# Patient Record
Sex: Male | Born: 1985 | Race: White | Hispanic: No | Marital: Single | Smoking: Former smoker
Health system: Southern US, Community
[De-identification: ages and names within clinical notes are randomized; demographics above are authoritative.]

## PROBLEM LIST (undated history)

## (undated) DIAGNOSIS — I10 Essential (primary) hypertension: Secondary | ICD-10-CM

## (undated) DIAGNOSIS — E7800 Pure hypercholesterolemia, unspecified: Secondary | ICD-10-CM

## (undated) HISTORY — DX: Pure hypercholesterolemia, unspecified: E78.00

## (undated) HISTORY — PX: APPENDECTOMY: SHX54

## (undated) HISTORY — PX: TONSILLECTOMY: SUR1361

## (undated) HISTORY — PX: MOUTH SURGERY: SHX715

## (undated) HISTORY — DX: Essential (primary) hypertension: I10

---

## 2017-03-23 ENCOUNTER — Ambulatory Visit (INDEPENDENT_AMBULATORY_CARE_PROVIDER_SITE_OTHER): Payer: 59 | Admitting: Neurology

## 2017-03-23 ENCOUNTER — Encounter (INDEPENDENT_AMBULATORY_CARE_PROVIDER_SITE_OTHER): Payer: Self-pay

## 2017-03-23 ENCOUNTER — Encounter: Payer: Self-pay | Admitting: Neurology

## 2017-03-23 DIAGNOSIS — R42 Dizziness and giddiness: Secondary | ICD-10-CM | POA: Diagnosis not present

## 2017-03-23 DIAGNOSIS — G44209 Tension-type headache, unspecified, not intractable: Secondary | ICD-10-CM | POA: Diagnosis not present

## 2017-03-23 NOTE — Progress Notes (Signed)
PATIENT: Garrett Harris DOB: Nov 12, 1985  Chief Complaint  Patient presents with  . NP PRADHAN  . light headedness/ tingling in weakness in arms and legs.    being worked up by cardiology.      HISTORICAL  Garrett Harris a 31 years old right-handed male, seen in refer by his primary care physician Dr.  Vernell Leep, Trudie Reed K, for evaluation of lightheadedness and  numbness tingling in arms and legs,initial evaluation was on August sixth 2018.  I reviewed and summarized the referring notes, he has past medical history of hyperlipidemia,  He was taken to the [facility] emergency room on March 09 2017,one hour after lunch, he was sitting at his desk, he  felt heart palpitation, lightheadedness, when he get up walking, he felt weakness, arm tingling sensation, as if he is gong to passout, he was taken by his coworker to the emergency room, noted mild elevated blood pressure165 over 95, EKG was normal, he has mild headache afterwards.  He later had cardiac evaluations, Stress test is pending,48 Holter monitoring was completed, no significant abnormalities found.  Laboratory evaluations Vitamin B12 370, normal CBC hemoglobin of 15.5, LDL was mildly elevated 139, total cholesterol was 155, triglyceride was 261, normal CMP, creatinine was 1.0  REVIEW OF SYSTEMS: Full 14 system review of systems performed and notable only for headache, numbness, weakness, dizziness, insomnia, blurred vision, shortness of breath, chest pain, spinning sensation  ALLERGIES: No Known Allergies  HOME MEDICATIONS: Current Outpatient Prescriptions  Medication Sig Dispense Refill  . atorvastatin (LIPITOR) 20 MG tablet Take 20 mg by mouth daily at 6 PM.    . cholecalciferol (VITAMIN D) 1000 units tablet Take 1,000 Units by mouth daily.    . Melatonin 3 MG TABS Take by mouth. Takes one nightly    . Omega-3 Fatty Acids (FISH OIL) 1000 MG CAPS Take by mouth. One twice daily     No current facility-administered medications for  this visit.     PAST MEDICAL HISTORY: Past Medical History:  Diagnosis Date  . High cholesterol   . Hypertension     PAST SURGICAL HISTORY: Past Surgical History:  Procedure Laterality Date  . APPENDECTOMY    . MOUTH SURGERY     removed uvula  . TONSILLECTOMY      FAMILY HISTORY: Family History  Problem Relation Age of Onset  . Liver cancer Father     SOCIAL HISTORY:  Social History   Social History  . Marital status: Single    Spouse name: N/A  . Number of children: N/A  . Years of education: N/A   Occupational History  . Not on file.   Social History Main Topics  . Smoking status: Former Smoker    Types: E-cigarettes    Quit date: 08/19/2015  . Smokeless tobacco: Never Used  . Alcohol use Yes     Comment: 2-3 x yearly  . Drug use: No  . Sexual activity: Not on file   Other Topics Concern  . Not on file   Social History Narrative   Lives home alone.  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  No children.        PHYSICAL EXAM   Vitals:   03/23/17 1252  BP: (!) 153/95  Pulse: 94  Weight: (!) 338 lb (153.3 kg)  Height: 6' 1.5" (1.867 m)    Not recorded      Body mass index is 43.99 kg/m.  PHYSICAL EXAMNIATION:  Gen: NAD, conversant, well nourised, obese,  well groomed                     Cardiovascular: Regular rate rhythm, no peripheral edema, warm, nontender. Eyes: Conjunctivae clear without exudates or hemorrhage Neck: Supple, no carotid bruits. Pulmonary: Clear to auscultation bilaterally   NEUROLOGICAL EXAM:  MENTAL STATUS: Speech:    Speech is normal; fluent and spontaneous with normal comprehension.  Cognition:     Orientation to time, place and person     Normal recent and remote memory     Normal Attention span and concentration     Normal Language, naming, repeating,spontaneous speech     Fund of knowledge   CRANIAL NERVES: CN II: Visual fields are full to confrontation. Fundoscopic exam is normal with sharp discs and  no vascular changes. Pupils are round equal and briskly reactive to light. CN III, IV, VI: extraocular movement are normal. No ptosis. CN V: Facial sensation is intact to pinprick in all 3 divisions bilaterally. Corneal responses are intact.  CN VII: Face is symmetric with normal eye closure and smile. CN VIII: Hearing is normal to rubbing fingers CN IX, X: Palate elevates symmetrically. Phonation is normal. CN XI: Head turning and shoulder shrug are intact CN XII: Tongue is midline with normal movements and no atrophy.  MOTOR: There is no pronator drift of out-stretched arms. Muscle bulk and tone are normal. Muscle strength is normal.  REFLEXES: Reflexes are 2+ and symmetric at the biceps, triceps, knees, and ankles. Plantar responses are flexor.  SENSORY: Intact to light touch, pinprick, positional sensation and vibratory sensation are intact in fingers and toes.  COORDINATION: Rapid alternating movements and fine finger movements are intact. There is no dysmetria on finger-to-nose and heel-knee-shin.    GAIT/STANCE: Posture is normal. Gait is steady with normal steps, base, arm swing, and turning. Heel and toe walking are normal. Tandem gait is normal.  Romberg is absent.   DIAGNOSTIC DATA (LABS, IMAGING, TESTING) - I reviewed patient records, labs, notes, testing and imaging myself where available.   ASSESSMENT AND PLAN  Garrett Harris is a 31 y.o. male   Acute onset dizziness, paresthesia,  Most consistent with presyncope,   After discuss with patient, will proceed with MRI brain to rule out CNS structure lesion   Garrett Harris, M.D. Ph.D.  [facility] [address], [address] [place], [place] [address] Ph: [phone] Fax: [phone]  CC: Elise BennePradhan, Pradeep K, MD

## 2017-03-30 ENCOUNTER — Other Ambulatory Visit: Payer: Self-pay | Admitting: Neurology

## 2017-03-30 DIAGNOSIS — T1590XA Foreign body on external eye, part unspecified, unspecified eye, initial encounter: Secondary | ICD-10-CM

## 2017-04-10 ENCOUNTER — Inpatient Hospital Stay: Admission: RE | Admit: 2017-04-10 | Payer: Self-pay | Source: Ambulatory Visit

## 2017-04-10 ENCOUNTER — Other Ambulatory Visit: Payer: 59

## 2017-04-10 ENCOUNTER — Other Ambulatory Visit: Payer: Self-pay

## 2017-04-21 ENCOUNTER — Ambulatory Visit
Admission: RE | Admit: 2017-04-21 | Discharge: 2017-04-21 | Disposition: A | Discharge status: Home / Self Care | Payer: 59 | Source: Ambulatory Visit | Attending: Neurology | Admitting: Neurology

## 2017-04-21 DIAGNOSIS — G44209 Tension-type headache, unspecified, not intractable: Secondary | ICD-10-CM | POA: Diagnosis not present

## 2017-04-21 DIAGNOSIS — T1590XA Foreign body on external eye, part unspecified, unspecified eye, initial encounter: Secondary | ICD-10-CM

## 2017-08-27 ENCOUNTER — Encounter: Payer: Self-pay | Admitting: Neurology

## 2017-08-27 ENCOUNTER — Ambulatory Visit (INDEPENDENT_AMBULATORY_CARE_PROVIDER_SITE_OTHER): Payer: BLUE CROSS/BLUE SHIELD | Admitting: Neurology

## 2017-08-27 VITALS — BP 156/93 | HR 89 | Ht 73.5 in | Wt 346.5 lb

## 2017-08-27 DIAGNOSIS — R42 Dizziness and giddiness: Secondary | ICD-10-CM | POA: Diagnosis not present

## 2017-08-27 DIAGNOSIS — R410 Disorientation, unspecified: Secondary | ICD-10-CM | POA: Diagnosis not present

## 2017-08-27 MED ORDER — DIVALPROEX SODIUM ER 500 MG PO TB24: 500.0000 mg | ORAL_TABLET | Freq: Every day | ORAL | 11 refills | Status: DC

## 2017-08-27 NOTE — Progress Notes (Signed)
PATIENT: Garrett Harris DOB: 1986-02-06  Chief Complaint  Patient presents with  . Seizures    Patient referred for possible seizures and/or evaluate for MS  . PCP    Dr. Corrie Mckusick  . Referred by    ENT - Merri Brunette, PA     HISTORICAL  Garrett Harris a 32 years old right-handed male, seen in refer by his primary care physician Dr.  Vernell Leep, Trudie Reed K, for evaluation of lightheadedness and  numbness tingling in arms and legs,initial evaluation was on August sixth 2018.  I reviewed and summarized the referring notes, he has past medical history of hyperlipidemia,  He was taken to the [facility] emergency room on March 09 2017,one hour after lunch, he was sitting at his desk, he  felt heart palpitation, lightheadedness, when he get up walking, he felt weakness, arm tingling sensation, as if he is gong to passout, he was taken by his coworker to the emergency room, noted mild elevated blood pressure165 over 95, EKG was normal, he has mild headache afterwards.  He later had cardiac evaluations, Stress test is pending,48 Holter monitoring was completed, no significant abnormalities found.  Laboratory evaluations Vitamin B12 370, normal CBC hemoglobin of 15.5, LDL was mildly elevated 139, total cholesterol was 155, triglyceride was 261, normal CMP, creatinine was 1.0  UPDATE Aug 27 2017: He was seen by Dr. Earna Coder, Cardiologist, stress test, ECHO, Holter monitor 48 hours and 30 days were normal other than showed 3 second asymptomatic pause  He has few push button events, but no abnormal EKG findings.   He still complains of frequent light headed sensation, almost daily, it usually happened after lunch, get up walking, going to pass out, knee were heavy, symptoms improve over few minutes.  He was diagnosed with obstructive sleep apnea at age 44, had uvuloplasty surgery, which has helped his snoring, but he continues to have daytime sleepiness, difficulty falling asleep at nighttime,  take melatonin, but was previously diagnosed with circadian rhythm problem  REVIEW OF SYSTEMS: Full 14 system review of systems performed and notable only for headache, numbness, weakness, dizziness, insomnia, blurred vision, shortness of breath, chest pain, spinning sensation  ALLERGIES: No Known Allergies  HOME MEDICATIONS: Current Outpatient Medications  Medication Sig Dispense Refill  . atorvastatin (LIPITOR) 40 MG tablet Take 40 mg by mouth daily.    . cholecalciferol (VITAMIN D) 1000 units tablet Take 1,000 Units by mouth daily.    . Melatonin 3 MG TABS Take by mouth. Takes one nightly    . Omega-3 Fatty Acids (FISH OIL) 1000 MG CAPS Take by mouth. One twice daily     No current facility-administered medications for this visit.     PAST MEDICAL HISTORY: Past Medical History:  Diagnosis Date  . High cholesterol   . Hypertension     PAST SURGICAL HISTORY: Past Surgical History:  Procedure Laterality Date  . APPENDECTOMY    . MOUTH SURGERY     removed uvula  . TONSILLECTOMY      FAMILY HISTORY: Family History  Problem Relation Age of Onset  . Liver cancer Father     SOCIAL HISTORY:  Social History   Social History  . Marital status: Single    Spouse name: N/A  . Number of children: N/A  . Years of education: N/A   Occupational History  . Not on file.   Social History Main Topics  . Smoking status: Former Smoker    Types: E-cigarettes    Quit  date: 08/19/2015  . Smokeless tobacco: Never Used  . Alcohol use Yes     Comment: 2-3 x yearly  . Drug use: No  . Sexual activity: Not on file   Other Topics Concern  . Not on file   Social History Narrative   Lives home alone.  Education AD.  [employer].  No children.        PHYSICAL EXAM   Vitals:   08/27/17 1350  BP: (!) 156/93  Pulse: 89  Weight: (!) 346 lb 8 oz (157.2 kg)  Height: 6' 1.5" (1.867 m)    Not recorded      Body mass index is 45.1 kg/m.  PHYSICAL  EXAMNIATION:  Gen: NAD, conversant, well nourised, obese, well groomed                     Cardiovascular: Regular rate rhythm, no peripheral edema, warm, nontender. Eyes: Conjunctivae clear without exudates or hemorrhage Neck: Supple, no carotid bruits. Pulmonary: Clear to auscultation bilaterally   NEUROLOGICAL EXAM:  MENTAL STATUS: Speech:    Speech is normal; fluent and spontaneous with normal comprehension.  Cognition:     Orientation to time, place and person     Normal recent and remote memory     Normal Attention span and concentration     Normal Language, naming, repeating,spontaneous speech     Fund of knowledge   CRANIAL NERVES: CN II: Visual fields are full to confrontation. Fundoscopic exam is normal with sharp discs and no vascular changes. Pupils are round equal and briskly reactive to light. CN III, IV, VI: extraocular movement are normal. No ptosis. CN V: Facial sensation is intact to pinprick in all 3 divisions bilaterally. Corneal responses are intact.  CN VII: Face is symmetric with normal eye closure and smile. CN VIII: Hearing is normal to rubbing fingers CN IX, X: Palate elevates symmetrically. Phonation is normal. CN XI: Head turning and shoulder shrug are intact CN XII: Tongue is midline with normal movements and no atrophy.  MOTOR: There is no pronator drift of out-stretched arms. Muscle bulk and tone are normal. Muscle strength is normal.  REFLEXES: Reflexes are 2+ and symmetric at the biceps, triceps, knees, and ankles. Plantar responses are flexor.  SENSORY: Intact to light touch, pinprick, positional sensation and vibratory sensation are intact in fingers and toes.  COORDINATION: Rapid alternating movements and fine finger movements are intact. There is no dysmetria on finger-to-nose and heel-knee-shin.    GAIT/STANCE: Posture is normal. Gait is steady with normal steps, base, arm swing, and turning. Heel and toe walking are normal. Tandem gait  is normal.  Romberg is absent.   DIAGNOSTIC DATA (LABS, IMAGING, TESTING) - I reviewed patient records, labs, notes, testing and imaging myself where available.   ASSESSMENT AND PLAN  Garrett Harris is a 32 y.o. male   Recurrent episode of transient lightheadedness  Differentiation diagnosis includes metabolic etiology, hypoglycemia, excessive sleepiness, remote possibility of seizure  Laboratory evaluations  EEG  Empirically treat with Depakote ER 500 mg daily  Refer him to sleep study  Levert FeinsteinYijun Yanna Leaks, M.D. Ph.D.  [facility] [address] [place], [place] [address] Ph: [phone] Fax: [phone]  CC: Elise BennePradhan, Pradeep K, MD

## 2017-08-28 LAB — C-REACTIVE PROTEIN: CRP: 2.2 mg/L (ref 0.0–4.9)

## 2017-08-28 LAB — CBC WITH DIFFERENTIAL/PLATELET
BASOS: 1 %
Basophils Absolute: 0.1 10*3/uL (ref 0.0–0.2)
EOS (ABSOLUTE): 0.3 10*3/uL (ref 0.0–0.4)
EOS: 3 %
HEMOGLOBIN: 16.1 g/dL (ref 13.0–17.7)
Hematocrit: 47.2 % (ref 37.5–51.0)
IMMATURE GRANS (ABS): 0 10*3/uL (ref 0.0–0.1)
Immature Granulocytes: 0 %
Lymphocytes Absolute: 3 10*3/uL (ref 0.7–3.1)
Lymphs: 29 %
MCH: 29.8 pg (ref 26.6–33.0)
MCHC: 34.1 g/dL (ref 31.5–35.7)
MCV: 87 fL (ref 79–97)
MONOCYTES: 8 %
Monocytes Absolute: 0.8 10*3/uL (ref 0.1–0.9)
NEUTROS ABS: 6.2 10*3/uL (ref 1.4–7.0)
Neutrophils: 59 %
Platelets: 253 10*3/uL (ref 150–379)
RBC: 5.41 x10E6/uL (ref 4.14–5.80)
RDW: 13.6 % (ref 12.3–15.4)
WBC: 10.4 10*3/uL (ref 3.4–10.8)

## 2017-08-28 LAB — COMPREHENSIVE METABOLIC PANEL
ALBUMIN: 4.6 g/dL (ref 3.5–5.5)
ALT: 26 IU/L (ref 0–44)
AST: 23 IU/L (ref 0–40)
Albumin/Globulin Ratio: 1.6 (ref 1.2–2.2)
Alkaline Phosphatase: 123 IU/L — ABNORMAL HIGH (ref 39–117)
BILIRUBIN TOTAL: 0.4 mg/dL (ref 0.0–1.2)
BUN / CREAT RATIO: 11 (ref 9–20)
BUN: 11 mg/dL (ref 6–20)
CHLORIDE: 105 mmol/L (ref 96–106)
CO2: 22 mmol/L (ref 20–29)
CREATININE: 0.96 mg/dL (ref 0.76–1.27)
Calcium: 9.4 mg/dL (ref 8.7–10.2)
GFR calc non Af Amer: 105 mL/min/{1.73_m2} (ref >59)
GFR, EST AFRICAN AMERICAN: 121 mL/min/{1.73_m2} (ref >59)
GLUCOSE: 88 mg/dL (ref 65–99)
Globulin, Total: 2.8 g/dL (ref 1.5–4.5)
Potassium: 4.4 mmol/L (ref 3.5–5.2)
Sodium: 146 mmol/L — ABNORMAL HIGH (ref 134–144)
TOTAL PROTEIN: 7.4 g/dL (ref 6.0–8.5)

## 2017-08-28 LAB — SEDIMENTATION RATE: Sed Rate: 15 mm/hr (ref 0–15)

## 2017-08-28 LAB — HGB A1C W/O EAG: Hgb A1c MFr Bld: 5.2 % (ref 4.8–5.6)

## 2017-08-28 LAB — VITAMIN B12: Vitamin B-12: 466 pg/mL (ref 232–1245)

## 2017-08-30 ENCOUNTER — Telehealth: Payer: Self-pay | Admitting: *Deleted

## 2017-08-30 NOTE — Telephone Encounter (Signed)
-----   Message from Levert FeinsteinYijun [name], MD sent at 08/28/2017 12:08 PM EST ----- Please call patient no significant abnormality on laboratory evaluations,

## 2017-08-31 NOTE — Telephone Encounter (Signed)
Spoke to patient he is aware of results.

## 2017-09-03 ENCOUNTER — Ambulatory Visit (INDEPENDENT_AMBULATORY_CARE_PROVIDER_SITE_OTHER): Payer: BLUE CROSS/BLUE SHIELD | Admitting: Neurology

## 2017-09-03 DIAGNOSIS — R42 Dizziness and giddiness: Secondary | ICD-10-CM

## 2017-09-03 DIAGNOSIS — R410 Disorientation, unspecified: Secondary | ICD-10-CM | POA: Diagnosis not present

## 2017-09-04 NOTE — Procedures (Signed)
   HISTORY: 32 years old male, complains of intermittent episode of lightheadedness,  TECHNIQUE:  16 channel EEG was performed based on standard 10-16 international system. One channel was dedicated to EKG, which has demonstrates normal sinus rhythm of 78 beats per minutes.  Upon awakening, the posterior background activity was well-developed, in alpha range, 11 Hz, reactive to eye opening and closure.  There was no evidence of epileptiform discharge.  Photic stimulation was performed, which induced a symmetric photic driving.  Hyperventilation was performed, there was no abnormality elicit.  No sleep was achieved.  CONCLUSION: This is a  normal awake EEG.  There is no electrodiagnostic evidence of epileptiform discharge.  Levert FeinsteinYijun Dajohn Ellender, M.D. Ph.D.  [facility] [address] [place], [place] [address] Phone: [phone] Fax:      [phone]

## 2017-09-21 ENCOUNTER — Encounter: Payer: Self-pay | Admitting: Neurology

## 2017-09-21 ENCOUNTER — Ambulatory Visit (INDEPENDENT_AMBULATORY_CARE_PROVIDER_SITE_OTHER): Payer: BLUE CROSS/BLUE SHIELD | Admitting: Neurology

## 2017-09-21 VITALS — BP 138/89 | HR 91 | Ht 73.5 in | Wt 343.0 lb

## 2017-09-21 DIAGNOSIS — G479 Sleep disorder, unspecified: Secondary | ICD-10-CM

## 2017-09-21 DIAGNOSIS — Z8669 Personal history of other diseases of the nervous system and sense organs: Secondary | ICD-10-CM

## 2017-09-21 DIAGNOSIS — Z6841 Body Mass Index (BMI) 40.0 and over, adult: Secondary | ICD-10-CM | POA: Diagnosis not present

## 2017-09-21 DIAGNOSIS — Z820 Family history of epilepsy and other diseases of the nervous system: Secondary | ICD-10-CM

## 2017-09-21 NOTE — Patient Instructions (Addendum)

## 2017-09-21 NOTE — Progress Notes (Signed)
Subjective:    Patient ID: Garrett Harris is a 32 y.o. male.  HPI     Huston FoleySaima Venissa Nappi, MD, PhD [facility] [address], [address] [address] [place], [place] [address]  Dear Garrett EwingYijun,   I saw your patient, Garrett Harris Mcgillicuddy, upon your kind request, in my clinic today for initial consultation of his sleep disorder, concern for underlying obstructive sleep apnea. The patient is unaccompanied today. As you know, Mr. Garrett Harris is a 32 year old right-handed gentleman with an underlying medical history of palpitations, lightheadedness, recent dizzy spell, hypertension, hyperlipidemia, snoring, and or obstructive sleep apnea as a teenager with status post uvulectomy and TE/AE at age 32 or [age], and history of morbid obesity with a BMI of over 40, who reports a prior diagnosis of obstructive sleep apnea, he did not tolerate CPAP as a teenager and ended up having surgery in 2005. I reviewed your office note from 08/27/2017. He has gained weight in the past, then tried to lose weight, now plateaued.  His Epworth sleepiness score is 5 out of 24 today, fatigue score is 22 out of 63. He is single and lives alone. He has a long-standing history of difficulty falling asleep. This started when he was a child. He remembers having difficulty going to sleep and a tendency to sleep longer into the morning, missing classes when he was in high school. Bedtime currently is around 9 or 10, he may not be asleep until midnight typically. He tries to take melatonin around 7. His mother has obstructive sleep apnea and uses a CPAP machine. He denies nocturia. Of note, he has not yet started Depakote that was prescribed to him by you, for fear of side effects. He is going to see a GI doctor soon and will most likely start the medication after his GI appointment.  His Past Medical History Is Significant For: Past Medical History:  Diagnosis Date  . High cholesterol   . Hypertension     His Past Surgical History Is  Significant For: Past Surgical History:  Procedure Laterality Date  . APPENDECTOMY    . MOUTH SURGERY     removed uvula  . TONSILLECTOMY      His Family History Is Significant For: Family History  Problem Relation Age of Onset  . Liver cancer Father     His Social History Is Significant For: Social History   Socioeconomic History  . Marital status: Single    Spouse name: None  . Number of children: None  . Years of education: None  . Highest education level: None  Social Needs  . Financial resource strain: None  . Food insecurity - worry: None  . Food insecurity - inability: None  . Transportation needs - medical: None  . Transportation needs - non-medical: None  Occupational History  . None  Tobacco Use  . Smoking status: Former Smoker    Types: E-cigarettes    Last attempt to quit: 08/19/2015    Years since quitting: 2.0  . Smokeless tobacco: Never Used  Substance and Sexual Activity  . Alcohol use: Yes    Comment: 2-3 x yearly  . Drug use: No  . Sexual activity: None  Other Topics Concern  . None  Social History Narrative   Lives home alone.  Education AD.  [employer].  No children.    His Allergies Are:  No Known Allergies:   His Current Medications Are:  Outpatient Encounter Medications as of 09/21/2017  Medication Sig  .  atorvastatin (LIPITOR) 40 MG tablet Take 40 mg by mouth daily.  . cholecalciferol (VITAMIN D) 1000 units tablet Take 1,000 Units by mouth daily.  . divalproex (DEPAKOTE ER) 500 MG 24 hr tablet Take 1 tablet (500 mg total) by mouth at bedtime.  . Melatonin 3 MG TABS Take by mouth. Takes one nightly  . Omega-3 Fatty Acids (FISH OIL) 1000 MG CAPS Take by mouth. One twice daily   No facility-administered encounter medications on file as of 09/21/2017.   :  Review of Systems:  Out of a complete 14 point review of systems, all are reviewed and negative with the exception of these symptoms as listed below: Review of Systems   Neurological:       Patient has seen multiple specialists for his symptoms but no one can seem to pinpoint a reason or source. Patient saw Dr. Terrace Arabia a few weeks ago and she is recommending a sleep study.   Epworth Sleepiness Scale 0= would never doze 1= slight chance of dozing 2= moderate chance of dozing 3= high chance of dozing  Sitting and reading: 0 Watching TV: 1 Sitting inactive in a public place (ex. Theater or meeting): 1 As a passenger in a car for an hour without a break: 0 Lying down to rest in the afternoon: 2 Sitting and talking to someone: 0 Sitting quietly after lunch (no alcohol): 1 In a car, while stopped in traffic: 0 Total: 5     Objective:  Neurological Exam  Physical Exam Physical Examination:   Vitals:   09/21/17 1509  BP: 138/89  Pulse: 91    General Examination: The patient is a very pleasant 32 y.o. male in no acute distress. He appears well-developed and well-nourished and well groomed.   HEENT: Normocephalic, atraumatic, pupils are equal, round and reactive to light and accommodation. Funduscopic exam is normal with sharp disc margins noted. Extraocular tracking is good without limitation to gaze excursion or nystagmus noted. Normal smooth pursuit is noted. Hearing is grossly intact. Tympanic membranes are clear bilaterally. Face is symmetric with normal facial animation and normal facial sensation. Speech is clear with no dysarthria noted. There is no hypophonia. There is no lip, neck/head, jaw or voice tremor. Neck is supple with full range of passive and active motion. There are no carotid bruits on auscultation. Oropharynx exam reveals: mild mouth dryness, adequate dental hygiene and moderate airway crowding, due to smaller airway entry, thicker soft palate. Tonsils and uvula are absent. Mallampati is class I. Neck circumference is 18-1/8 inches.  Chest: Clear to auscultation without wheezing, rhonchi or crackles noted.  Heart: S1+S2+0, regular  and normal without murmurs, rubs or gallops noted.   Abdomen: Soft, non-tender and non-distended with normal bowel sounds appreciated on auscultation.  Extremities: There is no pitting edema in the distal lower extremities bilaterally. Pedal pulses are intact.  Skin: Warm and dry without trophic changes noted.  Musculoskeletal: exam reveals no obvious joint deformities, tenderness or joint swelling or erythema.   Neurologically:  Mental status: The patient is awake, alert and oriented in all 4 spheres. His immediate and remote memory, attention, language skills and fund of knowledge are appropriate. There is no evidence of aphasia, agnosia, apraxia or anomia. Speech is clear with normal prosody and enunciation. Thought process is linear. Mood is normal and affect is normal.  Cranial nerves II - XII are as described above under HEENT exam. In addition: shoulder shrug is normal with equal shoulder height noted. Motor exam: Normal bulk,  strength and tone is noted. There is no drift, tremor or rebound. Romberg is negative. Fine motor skills and coordination: grossly intact.  Cerebellar testing: No dysmetria or intention tremor.  Sensory exam: intact to light touch.  Gait, station and balance: He stands easily. No veering to one side is noted. No leaning to one side is noted. Posture is age-appropriate and stance is narrow based. Gait shows normal stride length and is unremarkable. Intact toe and heel stance is noted.               Assessment and plan:  In summary, Vernis Eid is a very pleasant 32 y.o.-year old male with an underlying medical history of palpitations, lightheadedness, recent dizzy spell, hypertension, hyperlipidemia, snoring, and or obstructive sleep apnea as a teenager with status post uvulectomy and TE/AE at age 89 or 78, and history of morbid obesity with a BMI of over 40, who reports chronic difficulty going to sleep and a prior diagnosis of obstructive sleep apnea. He would be  willing to get retested. I had a long chat with the patient about my findings and the diagnosis of OSA, its prognosis and treatment options. We talked about medical treatments, surgical interventions and non-pharmacological approaches. I explained in particular the risks and ramifications of untreated moderate to severe OSA, especially with respect to developing cardiovascular disease down the Road, including congestive heart failure, difficult to treat hypertension, cardiac arrhythmias, or stroke. Even type 2 diabetes has, in part, been linked to untreated OSA. Symptoms of untreated OSA include daytime sleepiness, memory problems, mood irritability and mood disorder such as depression and anxiety, lack of energy, as well as recurrent headaches, especially morning headaches. We talked about trying to maintain a healthy lifestyle in general, as well as the importance of weight control. I encouraged the patient to eat healthy, exercise daily and keep well hydrated, to keep a scheduled bedtime and wake time routine, to not skip any meals and eat healthy snacks in between meals. I advised the patient not to drive when feeling sleepy.   Of note, he has not yet started the Depakote you prescribed. He would like to hold off until he sees GI. I recommended the following at this time: sleep study with potential positive airway pressure titration. (We will score hypopneas at 3%).   I explained the sleep test procedure to the patient and also outlined possible surgical and non-surgical treatment options of OSA, including the use of CPAP. He indicated that he would be willing to try CPAP if the need arises. I explained the importance of being compliant with PAP treatment, not only for insurance purposes but primarily to improve His symptoms, and for the patient's long term health benefit, including to reduce His cardiovascular risks. I answered all his questions today and the patient was in agreement. I will likely see him  back after the sleep study is completed and encouraged him to call with any interim questions, concerns, problems or updates.   Thank you very much for allowing me to participate in the care of this nice patient. If I can be of any further assistance to you please do not hesitate to talk to me.   Sincerely,   Huston Foley, MD, PhD

## 2017-10-20 ENCOUNTER — Other Ambulatory Visit: Payer: Self-pay

## 2017-10-20 MED ORDER — DIVALPROEX SODIUM ER 500 MG PO TB24: 500.0000 mg | ORAL_TABLET | Freq: Every day | ORAL | 1 refills | Status: DC

## 2017-10-20 NOTE — Telephone Encounter (Signed)
Spoke to Dr. Terrace ArabiaYan, she is agreeable to a 90 day request for pt's depakote. RX sent to [facility].

## 2017-10-30 ENCOUNTER — Ambulatory Visit (INDEPENDENT_AMBULATORY_CARE_PROVIDER_SITE_OTHER): Payer: BLUE CROSS/BLUE SHIELD | Admitting: Neurology

## 2017-10-30 DIAGNOSIS — Z820 Family history of epilepsy and other diseases of the nervous system: Secondary | ICD-10-CM

## 2017-10-30 DIAGNOSIS — Z789 Other specified health status: Secondary | ICD-10-CM

## 2017-10-30 DIAGNOSIS — G4720 Circadian rhythm sleep disorder, unspecified type: Secondary | ICD-10-CM

## 2017-10-30 DIAGNOSIS — Z8669 Personal history of other diseases of the nervous system and sense organs: Secondary | ICD-10-CM

## 2017-10-30 DIAGNOSIS — R42 Dizziness and giddiness: Secondary | ICD-10-CM

## 2017-10-30 DIAGNOSIS — G4733 Obstructive sleep apnea (adult) (pediatric): Secondary | ICD-10-CM | POA: Diagnosis not present

## 2017-10-30 DIAGNOSIS — G479 Sleep disorder, unspecified: Secondary | ICD-10-CM

## 2017-10-30 DIAGNOSIS — Z6841 Body Mass Index (BMI) 40.0 and over, adult: Secondary | ICD-10-CM

## 2017-11-06 NOTE — Addendum Note (Signed)
Addended by: Huston FoleyATHAR, Ryaan Vanwagoner on: 11/06/2017 09:26 AM   Modules accepted: Orders

## 2017-11-06 NOTE — Procedures (Signed)
PATIENT'S NAME:  Garrett Harris, Garrett Harris DOB:      May 19, 1986      MR#:    [id number]     DATE OF RECORDING: 10/30/2017 REFERRING M.D.: Dr. Levert Feinstein,        PCP: April Giles, NP Study Performed:   Baseline Polysomnogram HISTORY: 32 year old man with a history of palpitations, lightheadedness, recent dizzy spell, hypertension, hyperlipidemia, and obesity, who reports a prior diagnosis of obstructive sleep apnea. He did not tolerate CPAP at the time. The patient endorsed the Epworth Sleepiness Scale at 5 points. The patient's weight 344 pounds with a height of 73 (inches), resulting in a BMI of 45.6 kg/m2. The patient's neck circumference measured 18 inches.  CURRENT MEDICATIONS: Lipitor, Depakote, Melatonin   PROCEDURE:  This is a multichannel digital polysomnogram utilizing the Somnostar 11.2 system.  Electrodes and sensors were applied and monitored per AASM Specifications.   EEG, EOG, Chin and Limb EMG, were sampled at 200 Hz.  ECG, Snore and Nasal Pressure, Thermal Airflow, Respiratory Effort, CPAP Flow and Pressure, Oximetry was sampled at 50 Hz. Digital video and audio were recorded.      BASELINE STUDY  Lights Out was at 22:31 and Lights On at 05:02.  Total recording time (TRT) was 391 minutes, with a total sleep time (TST) of 355 minutes. The patient's sleep latency was 6 minutes.  REM latency was 117 minutes. The sleep efficiency was 90.8%.     SLEEP ARCHITECTURE: WASO (Wake after sleep onset) was 29.5 minutes with mild sleep fragmentation noted. There were 32 minutes in Stage N1, 192.5 minutes Stage N2, 47 minutes Stage N3 and 83.5 minutes in Stage REM.  The percentage of Stage N1 was 9.%, which is increased, Stage N2 was 54.2%, which is normal, Stage N3 was 13.2%, and Stage R (REM sleep) was 23.5%, which is normal. The arousals were noted as: 23 were spontaneous, 2 were associated with PLMs, 19 were associated with respiratory events.  Audio and video analysis did not show any abnormal or unusual  movements, behaviors, phonations or vocalizations. The patient took no bathroom breaks. Moderate snoring was noted. The EKG was in keeping with normal sinus rhythm (NSR).  RESPIRATORY ANALYSIS:  There were a total of 61 respiratory events:  3 obstructive apneas, 6 central apneas and 1 mixed apneas with a total of 10 apneas and an apnea index (AI) of 1.7 /hour. There were 51 hypopneas with a hypopnea index of 8.6 /hour. The patient also had 3 respiratory event related arousals (RERAs).      The total APNEA/HYPOPNEA INDEX (AHI) was 10.3/hour and the total RESPIRATORY DISTURBANCE INDEX was 10.8 /hour.  34 events occurred in REM sleep and 45 events in NREM. The REM AHI was 24.4 /hour, versus a non-REM AHI of 6.. The patient spent 266 minutes of total sleep time in the supine position and 89 minutes in non-supine.. The supine AHI was 12.2 versus a non-supine AHI of 4.7.  OXYGEN SATURATION & C02:  The Wake baseline 02 saturation was 96%, with the lowest being 84% (71% on technical report was error). Time spent below 89% saturation equaled 7 minutes.   PERIODIC LIMB MOVEMENTS: The patient had a total of 5 Periodic Limb Movements.  The Periodic Limb Movement (PLM) index was .8 and the PLM Arousal index was .3/hour.  Post-study, the patient indicated that sleep was better than usual.   IMPRESSION:  1. Obstructive Sleep Apnea (OSA) 2. Dysfunctions associated with sleep stages or arousal from sleep  RECOMMENDATIONS:  1. This study demonstrates overall mild obstructive sleep apnea, moderate in REM sleep with a total AHI of 10.3/hour, REM AHI of 24.4/hour, supine AHI of 12.2/hour and O2 nadir of 85%. Given the patient's medical history and sleep related complaints, a full-night CPAP titration study is recommended to optimize therapy. Other treatment options may include avoidance of supine sleep position along with weight loss, upper airway or jaw surgery in selected patients or the use of an oral appliance in  certain patients. ENT evaluation and/or consultation with a maxillofacial surgeon or dentist may be feasible in some instances. 2. This study shows sleep fragmentation and abnormal sleep stage percentages; these are nonspecific findings and per se do not signify an intrinsic sleep disorder or a cause for the patient's sleep-related symptoms. Causes include (but are not limited to) the first night effect of the sleep study, circadian rhythm disturbances, medication effect or an underlying mood disorder or medical problem.  3. The patient should be cautioned not to drive, work at heights, or operate dangerous or heavy equipment when tired or sleepy. Review and reiteration of good sleep hygiene measures should be pursued with any patient. 4. The patient will be seen in follow-[facility] for discussion of the test results and further management strategies. The referring provider will be notified of the test results.  I certify that I have reviewed the entire raw data recording prior to the issuance of this report in accordance with the Standards of Accreditation of the American Academy of Sleep Medicine (AASM)   Huston FoleySaima Muadh Creasy, MD, PhD Diplomat, American Board of Psychiatry and Neurology (Neurology and Sleep Medicine)

## 2017-11-06 NOTE — Progress Notes (Signed)
Patient referred by Dr. Terrace ArabiaYan, seen by me on 2.4.19, diagnostic PSG on 10/30/17.   Please call and notify the patient that the recent sleep study showed mild to moderate obstructive sleep apnea. I recommend treatment for this in the form of CPAP. This will require a repeat sleep study for proper titration and mask fitting and correct monitoring of the oxygen saturations. Please explain to patient. I have placed an order in the chart. Thanks.  Huston FoleySaima Airika Alkhatib, MD, PhD [facility]

## 2017-11-09 ENCOUNTER — Telehealth: Payer: Self-pay

## 2017-11-09 NOTE — Telephone Encounter (Signed)
Called to schedule pt for CPAP study.  Informed pt of results of baseline sleep study.  Pt understands results and is in agreement to come back to lab for CPAP titration study.  Pt is scheduled for 12/04/17 @ 8pm

## 2017-11-09 NOTE — Telephone Encounter (Signed)
Noted, thanks!

## 2017-12-04 ENCOUNTER — Ambulatory Visit (INDEPENDENT_AMBULATORY_CARE_PROVIDER_SITE_OTHER): Payer: BLUE CROSS/BLUE SHIELD | Admitting: Neurology

## 2017-12-04 DIAGNOSIS — Z6841 Body Mass Index (BMI) 40.0 and over, adult: Secondary | ICD-10-CM

## 2017-12-04 DIAGNOSIS — Z820 Family history of epilepsy and other diseases of the nervous system: Secondary | ICD-10-CM

## 2017-12-04 DIAGNOSIS — G4733 Obstructive sleep apnea (adult) (pediatric): Secondary | ICD-10-CM

## 2017-12-04 DIAGNOSIS — G4720 Circadian rhythm sleep disorder, unspecified type: Secondary | ICD-10-CM

## 2017-12-04 DIAGNOSIS — Z789 Other specified health status: Secondary | ICD-10-CM

## 2017-12-09 ENCOUNTER — Telehealth: Payer: Self-pay

## 2017-12-09 NOTE — Telephone Encounter (Signed)
I called pt. I advised pt that Dr. Frances FurbishAthar reviewed their sleep study results and found that pt did well during the latest sleep study with the cpap. Dr. Frances FurbishAthar recommends that pt start a cpap at home. I reviewed PAP compliance expectations with the pt. Pt is agreeable to starting a CPAP. I advised pt that an order will be sent to a DME, [facility], and [facility] will call the pt within about one week after they file with the pt's insurance. [facility] will show the pt how to use the machine, fit for masks, and troubleshoot the CPAP if needed. A follow up appt was made for insurance purposes with Dr. Frances FurbishAthar on 03/10/18 at 1:00pm. Pt verbalized understanding to arrive 15 minutes early and bring their CPAP. A letter with all of this information in it will be mailed to the pt as a reminder. I verified with the pt that the address we have on file is correct. Pt verbalized understanding of results. Pt had no questions at this time but was encouraged to call back if questions arise.

## 2017-12-09 NOTE — Procedures (Signed)
PATIENT'S NAME:  Garrett Harris, [name] DOB:      [birth year]      MR#:    [id number]     DATE OF RECORDING: 12/04/2017 REFERRING M.D.: Dr. Terrace ArabiaYan,          PCP: April Giles, NP Study Performed:   CPAP  Titration HISTORY: 32 year old man with a history of palpitations, lightheadedness, recent dizzy spell, hypertension, hyperlipidemia, and obesity, who returns for a full night CPAP titration study. His baseline sleep study from 10/30/17 showed a total AHI of 10.3/hour, REM AHI was 24.4 /hour, O2 nadir of 85%. The patient endorsed the Epworth Sleepiness Scale at 5 points, BMI of 45.6 kg/m2, neck size of 18 inches.  CURRENT MEDICATIONS: Lipitor, Depakote, Melatonin, Vitamin D,  Omega 3,  PROCEDURE:  This is a multichannel digital polysomnogram utilizing the SomnoStar 11.2 system.  Electrodes and sensors were applied and monitored per AASM Specifications.   EEG, EOG, Chin and Limb EMG, were sampled at 200 Hz.  ECG, Snore and Nasal Pressure, Thermal Airflow, Respiratory Effort, CPAP Flow and Pressure, Oximetry was sampled at 50 Hz. Digital video and audio were recorded.      The patient was fitted with an Eson nasal mask, size large, which he preferred over the nasal pillows. CPAP was initiated at 5 cmH20 with heated humidity per AASM standards and pressure was advanced to 12 cm H20 because of hypopneas, apneas and desaturations.  At a PAP pressure of 12 cmH20, there was a reduction of the AHI to 0/hour with supine REM sleep achieved and O2 nadir of 92%.   Lights Out was at 21:17 and Lights On at 04:57. Total recording time (TRT) was 460 minutes, with a total sleep time (TST) of 329 minutes. The patient's sleep latency was 134 minutes, which is markedly delayed. REM latency was 222 minutes, which is markedly delayed. The sleep efficiency was 71.5%, which is reduced.    SLEEP ARCHITECTURE: WASO (Wake after sleep onset) was 111.5 minutes with mild to moderate sleep fragmentation noted. There were 39.5 minutes in Stage N1,  150.5 minutes Stage N2, 49 minutes Stage N3 and 90 minutes in Stage REM.  The percentage of Stage N1 was 12.%, which is increased, Stage N2 was 45.7%, which is normal, Stage N3 was 14.9%, which is normal and Stage R (REM sleep) was 27.4%, which is mildly increased. The arousals were noted as: 6 were spontaneous, 0 were associated with PLMs, 8 were associated with respiratory events.  RESPIRATORY ANALYSIS:  There was a total of 15 respiratory events: 0 obstructive apneas, 0 central apneas and 0 mixed apneas with a total of 0 apneas and an apnea index (AI) of 0 /hour. There were 15 hypopneas with a hypopnea index of 2.7/hour. The patient also had 0 respiratory event related arousals (RERAs).      The total APNEA/HYPOPNEA INDEX  (AHI) was 2.70. /hour and the total RESPIRATORY DISTURBANCE INDEX was 2.7 0./hour  0 events occurred in REM sleep and 15 events in NREM. The REM AHI was 0 /hour versus a non-REM AHI of 3.8 0./hour.  The patient spent 278.5 minutes of total sleep time in the supine position and 51 minutes in non-supine. The supine AHI was 3.2, versus a non-supine AHI of 0.0.  OXYGEN SATURATION & C02:  The baseline 02 saturation was 97%, with the lowest being 86%. Time spent below 89% saturation equaled 13 minutes.  PERIODIC LIMB MOVEMENTS:  The patient had a total of 0 Periodic Limb Movements. The Periodic Limb  Movement (PLM) index was 0 and the PLM Arousal index was 0 /hour.  Audio and video analysis did not show any abnormal or unusual movements, behaviors, phonations or vocalizations. The patient took 1 bathroom break. The EKG was in keeping with normal sinus rhythm (NSR).  Post-study, the patient indicated that sleep was worse than usual.   IMPRESSION:   1. Obstructive Sleep Apnea (OSA) 2. Dysfunctions associated with sleep stages or arousal from sleep   RECOMMENDATIONS:   1. This study demonstrates resolution of the patient's obstructive sleep apnea with CPAP therapy. I will, therefore,  start the patient on home CPAP treatment at a pressure of 12 cm via large nasal mask with heated humidity. The patient should be reminded to be fully compliant with PAP therapy to improve sleep related symptoms and decrease long term cardiovascular risks. The patient should be reminded, that it may take up to 3 months to get fully used to using PAP with all planned sleep. The earlier full compliance is achieved, the better long term compliance tends to be. Please note that untreated obstructive sleep apnea may carry additional perioperative morbidity. Patients with significant obstructive sleep apnea should receive perioperative PAP therapy and the surgeons and particularly the anesthesiologist should be informed of the diagnosis and the severity of the sleep disordered breathing. 2. This study shows sleep fragmentation and abnormal sleep stage percentages; these are nonspecific findings and per se do not signify an intrinsic sleep disorder or a cause for the patient's sleep-related symptoms. Causes include (but are not limited to) the first night effect of the sleep study, circadian rhythm disturbances, medication effect or an underlying mood disorder or medical problem.  3. The patient should be cautioned not to drive, work at heights, or operate dangerous or heavy equipment when tired or sleepy. Review and reiteration of good sleep hygiene measures should be pursued with any patient. 4. The patient will be seen in follow-up by Dr. Frances Furbish at [facility] for discussion of the test results and further management strategies. The referring provider will be notified of the test results.   I certify that I have reviewed the entire raw data recording prior to the issuance of this report in accordance with the Standards of Accreditation of the American Academy of Sleep Medicine (AASM)     Huston Foley, MD, PhD Diplomat, American Board of Psychiatry and Neurology (Neurology and Sleep Medicine)

## 2017-12-09 NOTE — Addendum Note (Signed)
Addended by: Huston FoleyATHAR, Phinley Schall on: 12/09/2017 07:57 AM   Modules accepted: Orders

## 2017-12-09 NOTE — Telephone Encounter (Signed)
-----   Message from Garrett FoleySaima Athar, MD sent at 12/09/2017  7:57 AM EDT ----- Patient referred by Dr. Terrace ArabiaYan, seen by me on 09/21/17, diagnostic PSG on 10/30/17. Patient had a CPAP titration study on 12/04/17.  Please call and inform patient that I have entered an order for treatment with positive airway pressure (PAP) treatment for obstructive sleep apnea (OSA). He did well during the latest sleep study with CPAP. We will, therefore, arrange for a machine for home use through a DME (durable medical equipment) company of His choice; and I will see the patient back in follow-up in about 10 weeks. Please also explain to the patient that I will be looking out for compliance data, which can be downloaded from the machine (stored on an SD card, that is inserted in the machine) or via remote access through a modem, that is built into the machine. At the time of the followup appointment we will discuss sleep study results and how it is going with PAP treatment at home. Please advise patient to bring His machine at the time of the first FU visit, even though this is cumbersome. Bringing the machine for every visit after that will likely not be needed, but often helps for the first visit to troubleshoot if needed. Please re-enforce the importance of compliance with treatment and the need for us to monitor compliance data - often an insurance requirement and actually good feedback for the patient as far as how they are doing.  Also remind patient, that any interim PAP machine or mask issues should be first addressed with the DME company, as they can often help better with technical and mask fit issues. Please ask if patient has a preference regarding DME company.  Please also make sure, the patient has a follow-up appointment with me in about 10 weeks from the setup date, thanks. May see one of our nurse practitioners if needed for proper timing of the FU appointment.  Please fax or rout report to the referring provider. Thanks,    Garrett FoleySaima Athar, MD, PhD [facility]

## 2017-12-28 ENCOUNTER — Ambulatory Visit (INDEPENDENT_AMBULATORY_CARE_PROVIDER_SITE_OTHER): Payer: BLUE CROSS/BLUE SHIELD | Admitting: Neurology

## 2017-12-28 ENCOUNTER — Encounter: Payer: Self-pay | Admitting: Neurology

## 2017-12-28 VITALS — BP 144/88 | HR 81 | Ht 73.5 in | Wt 350.0 lb

## 2017-12-28 DIAGNOSIS — R42 Dizziness and giddiness: Secondary | ICD-10-CM

## 2017-12-28 DIAGNOSIS — R410 Disorientation, unspecified: Secondary | ICD-10-CM

## 2017-12-28 NOTE — Progress Notes (Signed)
PATIENT: Garrett Harris DOB: 01/11/1986  No chief complaint on file.    HISTORICAL  Garrett Harris a 32 years old right-handed male, seen in refer by his primary care physician Dr.  Shon Millet, Caffie Damme K, for evaluation of lightheadedness and  numbness tingling in arms and legs,initial evaluation was on August sixth 2018.  I reviewed and summarized the referring notes, he has past medical history of hyperlipidemia,  He was taken to the [facility] emergency room on March 09 2017,one hour after lunch, he was sitting at his desk, he  felt heart palpitation, lightheadedness, when he get up walking, he felt weakness, arm tingling sensation, as if he is gong to passout, he was taken by his coworker to the emergency room, noted mild elevated blood pressure165 over 95, EKG was normal, he has mild headache afterwards.  He later had cardiac evaluations, Stress test is pending,48 Holter monitoring was completed, no significant abnormalities found.  Laboratory evaluations Vitamin B12 370, normal CBC hemoglobin of 15.5, LDL was mildly elevated 139, total cholesterol was 155, triglyceride was 261, normal CMP, creatinine was 1.0  UPDATE Aug 27 2017: He was seen by Dr. Alroy Dust, Cardiologist, stress test, ECHO, Holter monitor 48 hours and 30 days were normal other than showed 3 second asymptomatic pause  He has few push button events, but no abnormal EKG findings.   He still complains of frequent light headed sensation, almost daily, it usually happened after lunch, get up walking, going to pass out, knee were heavy, symptoms improve over few minutes.  He was diagnosed with obstructive sleep apnea at age 10, had uvuloplasty surgery, which has helped his snoring, but he continues to have daytime sleepiness, difficulty falling asleep at nighttime, take melatonin, but was previously diagnosed with circadian rhythm problem  UPDATE Dec 28 2017: MRI brain in Sept 2018 was normal.  EEG was normal in April  2019  Sleep study confirmed obstructive sleep apnea, CPAP machine pending.   He still feel light headehd, weird feeling at nose bridge, pressure, last for few seconds, multipel times during the day, He might feel like this for 1 week, will have days without symptoms,  He did not try previous prescription of Depakote,  Laboratory evaluation showed normal negative C-reactive protein, CBC A1c ESR, B12, TSH, CMP  REVIEW OF SYSTEMS: Full 14 system review of systems performed and notable only for dizziness, numbness, decreased concentration, anxiety, bruise easily, neck pain, neck stiffness ALLERGIES: No Known Allergies  HOME MEDICATIONS: Current Outpatient Medications  Medication Sig Dispense Refill  . atorvastatin (LIPITOR) 40 MG tablet Take 40 mg by mouth daily.    . cholecalciferol (VITAMIN D) 1000 units tablet Take 1,000 Units by mouth daily.    . divalproex (DEPAKOTE ER) 500 MG 24 hr tablet Take 1 tablet (500 mg total) by mouth at bedtime. 90 tablet 1  . Melatonin 3 MG TABS Take by mouth. Takes one nightly    . Omega-3 Fatty Acids (FISH OIL) 1000 MG CAPS Take by mouth. One twice daily     No current facility-administered medications for this visit.     PAST MEDICAL HISTORY: Past Medical History:  Diagnosis Date  . High cholesterol   . Hypertension     PAST SURGICAL HISTORY: Past Surgical History:  Procedure Laterality Date  . APPENDECTOMY    . MOUTH SURGERY     removed uvula  . TONSILLECTOMY      FAMILY HISTORY: Family History  Problem Relation Age of Onset  . Liver  cancer Father     SOCIAL HISTORY:  Social History   Social History  . Marital status: Single    Spouse name: N/A  . Number of children: N/A  . Years of education: N/A   Occupational History  . Not on file.   Social History Main Topics  . Smoking status: Former Smoker    Types: E-cigarettes    Quit date: 08/19/2015  . Smokeless tobacco: Never Used  . Alcohol use Yes     Comment: 2-3 x yearly   . Drug use: No  . Sexual activity: Not on file   Other Topics Concern  . Not on file   Social History Narrative   Lives home alone.  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        PHYSICAL EXAM   There were no vitals filed for this visit.  Not recorded      There is no height or weight on file to calculate BMI.  PHYSICAL EXAMNIATION:  Gen: NAD, conversant, well nourised, obese, well groomed                     Cardiovascular: Regular rate rhythm, no peripheral edema, warm, nontender. Eyes: Conjunctivae clear without exudates or hemorrhage Neck: Supple, no carotid bruits. Pulmonary: Clear to auscultation bilaterally   NEUROLOGICAL EXAM:  MENTAL STATUS: Speech:    Speech is normal; fluent and spontaneous with normal comprehension.  Cognition:     Orientation to time, place and person     Normal recent and remote memory     Normal Attention span and concentration     Normal Language, naming, repeating,spontaneous speech     Fund of knowledge   CRANIAL NERVES: CN II: Visual fields are full to confrontation. Fundoscopic exam is normal with sharp discs and no vascular changes. Pupils are round equal and briskly reactive to light. CN III, IV, VI: extraocular movement are normal. No ptosis. CN V: Facial sensation is intact to pinprick in all 3 divisions bilaterally. Corneal responses are intact.  CN VII: Face is symmetric with normal eye closure and smile. CN VIII: Hearing is normal to rubbing fingers CN IX, X: Palate elevates symmetrically. Phonation is normal. CN XI: Head turning and shoulder shrug are intact CN XII: Tongue is midline with normal movements and no atrophy.  MOTOR: There is no pronator drift of out-stretched arms. Muscle bulk and tone are normal. Muscle strength is normal.  REFLEXES: Reflexes are 2+ and symmetric at the biceps, triceps, knees, and ankles. Plantar responses are flexor.  SENSORY: Intact to light touch, pinprick,  positional sensation and vibratory sensation are intact in fingers and toes.  COORDINATION: Rapid alternating movements and fine finger movements are intact. There is no dysmetria on finger-to-nose and heel-knee-shin.    GAIT/STANCE: Posture is normal. Gait is steady with normal steps, base, arm swing, and turning. Heel and toe walking are normal. Tandem gait is normal.  Romberg is absent.   DIAGNOSTIC DATA (LABS, IMAGING, TESTING) - I reviewed patient records, labs, notes, testing and imaging myself where available.   ASSESSMENT AND PLAN  Garrett Harris is a 32 y.o. male   Recurrent episode of transient lightheadedness  Unsure etiology,   MRI of the brain, EEG was normal,  Recent sleep study confirmed diagnosis of  obstructive sleep apnea, CPAP machine is pending,  Patient remains symptomatic, will give him a trial of Depakote, ER 500 mg daily,    Garrett Harris, M.D. Ph.D.  [facility] [address], [place], [place] [address] Ph: [phone] Fax: [phone]  CC: Garrett Sos, MD

## 2018-03-09 ENCOUNTER — Encounter: Payer: Self-pay | Admitting: Neurology

## 2018-03-10 ENCOUNTER — Encounter: Payer: Self-pay | Admitting: Neurology

## 2018-03-10 ENCOUNTER — Ambulatory Visit (INDEPENDENT_AMBULATORY_CARE_PROVIDER_SITE_OTHER): Payer: BLUE CROSS/BLUE SHIELD | Admitting: Neurology

## 2018-03-10 VITALS — BP 156/98 | HR 73 | Ht 73.5 in | Wt 354.0 lb

## 2018-03-10 DIAGNOSIS — G4733 Obstructive sleep apnea (adult) (pediatric): Secondary | ICD-10-CM | POA: Diagnosis not present

## 2018-03-10 DIAGNOSIS — Z9989 Dependence on other enabling machines and devices: Secondary | ICD-10-CM

## 2018-03-10 NOTE — Patient Instructions (Addendum)
Please continue using your CPAP regularly. While your insurance requires that you use CPAP at least 4 hours each night on 70% of the nights, I recommend, that you not skip any nights and use it throughout the night if you can. Getting used to CPAP and staying with the treatment long term does take time and patience and discipline. Untreated obstructive sleep apnea when it is moderate to severe can have an adverse impact on cardiovascular health and raise her risk for heart disease, arrhythmias, hypertension, congestive heart failure, stroke and diabetes. Untreated obstructive sleep apnea causes sleep disruption, nonrestorative sleep, and sleep deprivation. This can have an impact on your day to day functioning and cause daytime sleepiness and impairment of cognitive function, memory loss, mood disturbance, and problems focussing. Using CPAP regularly can improve these symptoms.  Keep up the good work! We can see you in 6 months, you can see one of our nurse practitioners as you are stable. I will see you after that.    

## 2018-03-10 NOTE — Progress Notes (Signed)
Subjective:    Patient ID: Garrett Harris is a 32 y.o. male.  HPI     Interim history:   Mr. Valeriano is a 32 year old right-handed gentleman with an underlying medical history of palpitations, lightheadedness, recent dizzy spell, hypertension, hyperlipidemia, snoring, and or obstructive sleep apnea as a teenager with status post uvulectomy and TE/AE at age 35 or 38, and history of morbid obesity with a BMI of over 54, who presents for follow-up consultation of his obstructive sleep apnea, after sleep study testing and started CPAP therapy at home. The patient is unaccompanied today. I first met him on 09/21/2017 at the request of Dr. Krista Blue, at which time he reported a previous diagnosis of obstructive sleep apnea, as well as intolerance to CPAP in the past. I asked him to proceed with sleep study testing. He had a baseline sleep study, followed by a CPAP titration study. I went over his test results with him in detail today. Baseline sleep study from 10/30/2017 showed a sleep efficiency of 90.8%, sleep latency 6 minutes, REM latency high-normal at 117 minutes. He had fairly normal sleep stage percentages. Moderate snoring was noted. Total AHI was in the mild range at 10.3 per hour, REM AHI in the moderate range at 24.4 per hour, supine AHI was 12.2 per hour, average oxygen saturation 96%, nadir was 84%. He had no significant PLMS. Based on his test results and his medical history I suggested we proceed with a sleep study with CPAP titration. He had this on 12/04/2017. Sleep efficiency was 71.5%, sleep latency delayed at 134 minutes, wake after sleep onset increase at 111.5 minutes with mild to moderate sleep fragmentation noted. He had a fairly normal percentage of stage II sleep, an increased percentage of stage I sleep, normal percentage of slow-wave sleep and a mildly increased percentage of REM sleep at 27.4% with a significantly delayed REM latency. He was titrated via nasal mask from 5 cm to 12 cm. On the  final pressure his AHI was 0 per hour with supine REM sleep achieved an O2 nadir of 92%. Based on his test results I prescribed CPAP therapy for home use at a pressure of 12 cm.  Today, 03/10/2018: I reviewed his CPAP compliance data from 02/08/2018 through 03/09/2018 which is a total of 30 days, during which time he used his CPAP 22 days with percent used days greater than 4 hours at 70%, indicating adequate compliance with an average usage of 6 hours and 7 minutes. Residual AHI at goal at 1.2 per hour, leak high with the 95th percentile at 27.7 L/m on a pressure of 12 cm. He reports feeling better since starting CPAP therapy. In particular, his sleep is more consolidated and daytime tiredness is less. He may have a slight improvement in his dizzy spells he feels. He can tell when he does not use his CPAP in that he has a mild headache when he has skipped a night. He is working on weight loss. He tries to hydrate well.  The patient's allergies, current medications, family history, past medical history, past social history, past surgical history and problem list were reviewed and updated as appropriate.   Previously (copied from previous notes for reference):   09/21/2017: (He) reports a prior diagnosis of obstructive sleep apnea, he did not tolerate CPAP as a teenager and ended up having surgery in 2005. I reviewed your office note from 08/27/2017. He has gained weight in the past, then tried to lose weight, now plateaued.  His  Epworth sleepiness score is 5 out of 24 today, fatigue score is 22 out of 63. He is single and lives alone. He has a long-standing history of difficulty falling asleep. This started when he was a child. He remembers having difficulty going to sleep and a tendency to sleep longer into the morning, missing classes when he was in high school. Bedtime currently is around 9 or 10, he may not be asleep until midnight typically. He tries to take melatonin around 7. His mother has  obstructive sleep apnea and uses a CPAP machine. He denies nocturia. Of note, he has not yet started Depakote that was prescribed to him by you, for fear of side effects. He is going to see a GI doctor soon and will most likely start the medication after his GI appointment.  His Past Medical History Is Significant For: Past Medical History:  Diagnosis Date  . High cholesterol   . Hypertension     His Past Surgical History Is Significant For: Past Surgical History:  Procedure Laterality Date  . APPENDECTOMY    . MOUTH SURGERY     removed uvula  . TONSILLECTOMY      His Family History Is Significant For: Family History  Problem Relation Age of Onset  . Liver cancer Father     His Social History Is Significant For: Social History   Socioeconomic History  . Marital status: Single    Spouse name: Not on file  . Number of children: Not on file  . Years of education: Not on file  . Highest education level: Not on file  Occupational History  . Not on file  Social Needs  . Financial resource strain: Not on file  . Food insecurity:    Worry: Not on file    Inability: Not on file  . Transportation needs:    Medical: Not on file    Non-medical: Not on file  Tobacco Use  . Smoking status: Former Smoker    Types: E-cigarettes    Last attempt to quit: 08/19/2015    Years since quitting: 2.5  . Smokeless tobacco: Never Used  Substance and Sexual Activity  . Alcohol use: Yes    Comment: 2-3 x yearly  . Drug use: No  . Sexual activity: Not on file  Lifestyle  . Physical activity:    Days per week: Not on file    Minutes per session: Not on file  . Stress: Not on file  Relationships  . Social connections:    Talks on phone: Not on file    Gets together: Not on file    Attends religious service: Not on file    Active member of club or organization: Not on file    Attends meetings of clubs or organizations: Not on file    Relationship status: Not on file  Other Topics  Concern  . Not on file  Social History Narrative   Lives home alone.  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    His Allergies Are:  No Known Allergies:   His Current Medications Are:  Outpatient Encounter Medications as of 03/10/2018  Medication Sig  . atorvastatin (LIPITOR) 40 MG tablet Take 40 mg by mouth daily.  . divalproex (DEPAKOTE ER) 500 MG 24 hr tablet Take 1 tablet (500 mg total) by mouth at bedtime.  . Melatonin 3 MG TABS Take by mouth. Takes one nightly  . [DISCONTINUED] cholecalciferol (VITAMIN D) 1000 units tablet Take 1,000 Units by  mouth daily.  . [DISCONTINUED] Omega-3 Fatty Acids (FISH OIL) 1000 MG CAPS Take by mouth. One twice daily   No facility-administered encounter medications on file as of 03/10/2018.   :  Review of Systems:  Out of a complete 14 point review of systems, all are reviewed and negative with the exception of these symptoms as listed below:  Review of Systems  Neurological:       Pt presents today to discuss his cpap. Pt reports that he feels better while on pap therapy.    Objective:  Neurological Exam  Physical Exam Physical Examination:   Vitals:   03/10/18 1305  BP: (!) 156/98  Pulse: 73   General Examination: The patient is a very pleasant 32 y.o. male in no acute distress. He appears well-developed and well-nourished and well groomed. No lightheadedness reported.   HEENT: Normocephalic, atraumatic, pupils are equal, round and reactive to light and accommodation. Extraocular tracking is good without limitation to gaze excursion or nystagmus noted. Normal smooth pursuit is noted. Hearing is grossly intact. Face is symmetric with normal facial animation and normal facial sensation. Speech is clear with no dysarthria noted. There is no hypophonia. There is no lip, neck/head, jaw or voice tremor. Neck with FROM. Oropharynx exam reveals: unchanged.   Chest: Clear to auscultation without wheezing, rhonchi or crackles  noted.  Heart: S1+S2+0, regular and normal without murmurs, rubs or gallops noted.   Abdomen: Soft, non-tender and non-distended with normal bowel sounds appreciated on auscultation.  Extremities: There is no obvious change.   Skin: Warm and dry without trophic changes noted.  Musculoskeletal: exam reveals no obvious joint deformities, tenderness or joint swelling or erythema.   Neurologically:  Mental status: The patient is awake, alert and oriented in all 4 spheres. His immediate and remote memory, attention, language skills and fund of knowledge are appropriate. There is no evidence of aphasia, agnosia, apraxia or anomia. Speech is clear with normal prosody and enunciation. Thought process is linear. Mood is normal and affect is normal.  Cranial nerves II - XII are as described above under HEENT exam. In addition: shoulder shrug is normal with equal shoulder height noted. Motor exam: Normal bulk, strength and tone is noted. There is no tremor. Romberg is negative. Fine motor skills and coordination: grossly intact.  Cerebellar testing: No dysmetria or intention tremor.  Sensory exam: intact to light touch.  Gait, station and balance: He stands easily. No veering to one side is noted. No leaning to one side is noted. Posture is age-appropriate and stance is narrow based. Gait shows normal stride length and is unremarkable. Tandem walk normal.   Assessment and plan:  In summary, Wrangler Penning is a very pleasant 32 year old male with an underlying medical history of palpitations, lightheadedness, recent dizzy spell, hypertension, hyperlipidemia, s/p status post uvulectomy and TE/AE as a teenager, and morbid obesity with a BMI of over 40, who presents for follow-up consultation of his obstructive sleep apnea, after sleep study testing. He had a baseline sleep study in March 2019 which showed mild-to-moderate obstructive sleep apnea. He had a CPAP titration study in April which showed good  results with CPAP of 12 cm. He has established treatment with CPAP therapy at 12 cm via nasal mask. He is compliant with treatment and indicates good results, including improved sleep quality and sleep consolidation as well as daytime tiredness. He feels that his dizzy spells are slightly better as well. We talked about his sleep study results in detail  today and also reviewed his compliance data. I suggested a six-month follow-up from a sleep apnea standpoint, he can see what of a nurse practitioners next time. He is commended for his treatment adherence and encouraged to continue using CPAP regularly. I answered all his questions today and he was in agreement. I spent 25 minutes in total face-to-face time with the patient, more than 50% of which was spent in counseling and coordination of care, reviewing test results, reviewing medication and discussing or reviewing the diagnosis of OSA, its prognosis and treatment options. Pertinent laboratory and imaging test results that were available during this visit with the patient were reviewed by me and considered in my medical decision making (see chart for details).

## 2018-09-15 ENCOUNTER — Encounter: Payer: Self-pay | Admitting: Neurology

## 2018-09-20 ENCOUNTER — Ambulatory Visit (INDEPENDENT_AMBULATORY_CARE_PROVIDER_SITE_OTHER): Payer: BLUE CROSS/BLUE SHIELD | Admitting: Neurology

## 2018-09-20 ENCOUNTER — Ambulatory Visit: Payer: BLUE CROSS/BLUE SHIELD | Admitting: Nurse Practitioner

## 2018-09-20 ENCOUNTER — Encounter: Payer: Self-pay | Admitting: Neurology

## 2018-09-20 VITALS — BP 169/95 | HR 88 | Ht 73.5 in | Wt 360.0 lb

## 2018-09-20 DIAGNOSIS — Z9989 Dependence on other enabling machines and devices: Secondary | ICD-10-CM | POA: Diagnosis not present

## 2018-09-20 DIAGNOSIS — G4733 Obstructive sleep apnea (adult) (pediatric): Secondary | ICD-10-CM

## 2018-09-20 NOTE — Patient Instructions (Addendum)
Please continue using your CPAP regularly. While your insurance requires that you use CPAP at least 4 hours each night on 70% of the nights, I recommend, that you not skip any nights and use it throughout the night if you can. Getting used to CPAP and staying with the treatment long term does take time and patience and discipline. Untreated obstructive sleep apnea when it is moderate to severe can have an adverse impact on cardiovascular health and raise her risk for heart disease, arrhythmias, hypertension, congestive heart failure, stroke and diabetes. Untreated obstructive sleep apnea causes sleep disruption, nonrestorative sleep, and sleep deprivation. This can have an impact on your day to day functioning and cause daytime sleepiness and impairment of cognitive function, memory loss, mood disturbance, and problems focussing. Using CPAP regularly can improve these symptoms.  Please check with your DME provider about your supplies.   We can see you in 1 year, you can see one of our nurse practitioners.

## 2018-09-20 NOTE — Progress Notes (Signed)
Subjective:    Patient ID: Garrett Harris is a 33 y.o. male.  HPI     Interim history:  Garrett Harris is a 33 year old right-handed gentleman with an underlying medical history of palpitations, lightheadedness, recent dizzy spell, hypertension, hyperlipidemia, snoring, and or obstructive sleep apnea as a teenager with status post uvulectomy and TE/AE at age 52 or 66, and history of morbid obesity with a BMI of over 40, who presents for follow-up consultation of his obstructive sleep apnea, on CPAP therapy. The patient is unaccompanied today. I last saw him on 03/10/2018, at which time he was compliant with CPAP and reported feeling better. He felt that his daytime sleepiness was less in his sleep was more consolidated. He noticed a slight improvement in his dizzy spells as well.    Today, 09/20/2018: I reviewed his CPAP compliance data from 08/17/2018 through 09/15/2018 which is a total of 30 days, during which time he used his CPAP only 9 days with percent used days greater than 4 hours at 23% only, indicating poor compliance with an average usage of 5 hours and 42 minutes for days on treatment, residual AHI 1.3 per hour, leak on the high side with the 95th percentile at 27.3 L/m on a pressure of 12 cm. In the past 90 days he used his CPAP only 30 days. He reports that he tends to forget to use his CPAP. He will often fall asleep without putting it on. He has not had any new supplies since the original supplies. He is working on weight loss.  The patient's allergies, current medications, family history, past medical history, past social history, past surgical history and problem list were reviewed and updated as appropriate.    Previously (copied from previous notes for reference):    I first met him on 09/21/2017 at the request of Dr. Krista Blue, at which time he reported a previous diagnosis of obstructive sleep apnea, as well as intolerance to CPAP in the past. I asked him to proceed with sleep study  testing. He had a baseline sleep study, followed by a CPAP titration study. I went over his test results with him in detail today. Baseline sleep study from 10/30/2017 showed a sleep efficiency of 90.8%, sleep latency 6 minutes, REM latency high-normal at 117 minutes. He had fairly normal sleep stage percentages. Moderate snoring was noted. Total AHI was in the mild range at 10.3 per hour, REM AHI in the moderate range at 24.4 per hour, supine AHI was 12.2 per hour, average oxygen saturation 96%, nadir was 84%. He had no significant PLMS. Based on his test results and his medical history I suggested we proceed with a sleep study with CPAP titration. He had this on 12/04/2017. Sleep efficiency was 71.5%, sleep latency delayed at 134 minutes, wake after sleep onset increase at 111.5 minutes with mild to moderate sleep fragmentation noted. He had a fairly normal percentage of stage II sleep, an increased percentage of stage I sleep, normal percentage of slow-wave sleep and a mildly increased percentage of REM sleep at 27.4% with a significantly delayed REM latency. He was titrated via nasal mask from 5 cm to 12 cm. On the final pressure his AHI was 0 per hour with supine REM sleep achieved an O2 nadir of 92%. Based on his test results I prescribed CPAP therapy for home use at a pressure of 12 cm.   I reviewed his CPAP compliance data from 02/08/2018 through 03/09/2018 which is a total of 30 days, during  which time he used his CPAP 22 days with percent used days greater than 4 hours at 70%, indicating adequate compliance with an average usage of 6 hours and 7 minutes. Residual AHI at goal at 1.2 per hour, leak high with the 95th percentile at 27.7 L/m on a pressure of 12 cm.    09/21/2017: (He) reports a prior diagnosis of obstructive sleep apnea, he did not tolerate CPAP as a teenager and ended up having surgery in 2005. I reviewed your office note from 08/27/2017. He has gained weight in the past, then tried to  lose weight, now plateaued.  His Epworth sleepiness score is 5 out of 24 today, fatigue score is 22 out of 63. He is single and lives alone. He has a long-standing history of difficulty falling asleep. This started when he was a child. He remembers having difficulty going to sleep and a tendency to sleep longer into the morning, missing classes when he was in high school. Bedtime currently is around 9 or 10, he may not be asleep until midnight typically. He tries to take melatonin around 7. His mother has obstructive sleep apnea and uses a CPAP machine. He denies nocturia. Of note, he has not yet started Depakote that was prescribed to him by you, for fear of side effects. He is going to see a GI doctor soon and will most likely start the medication after his GI appointment.  His Past Medical History Is Significant For: Past Medical History:  Diagnosis Date  . High cholesterol   . Hypertension     His Past Surgical History Is Significant For: Past Surgical History:  Procedure Laterality Date  . APPENDECTOMY    . MOUTH SURGERY     removed uvula  . TONSILLECTOMY      His Family History Is Significant For: Family History  Problem Relation Age of Onset  . Liver cancer Father     His Social History Is Significant For: Social History   Socioeconomic History  . Marital status: Single    Spouse name: Not on file  . Number of children: Not on file  . Years of education: Not on file  . Highest education level: Not on file  Occupational History  . Not on file  Social Needs  . Financial resource strain: Not on file  . Food insecurity:    Worry: Not on file    Inability: Not on file  . Transportation needs:    Medical: Not on file    Non-medical: Not on file  Tobacco Use  . Smoking status: Former Smoker    Types: E-cigarettes    Last attempt to quit: 08/19/2015    Years since quitting: 3.0  . Smokeless tobacco: Never Used  Substance and Sexual Activity  . Alcohol use: Yes     Comment: 2-3 x yearly  . Drug use: No  . Sexual activity: Not on file  Lifestyle  . Physical activity:    Days per week: Not on file    Minutes per session: Not on file  . Stress: Not on file  Relationships  . Social connections:    Talks on phone: Not on file    Gets together: Not on file    Attends religious service: Not on file    Active member of club or organization: Not on file    Attends meetings of clubs or organizations: Not on file    Relationship status: Not on file  Other Topics Concern  . Not  on file  Social History Narrative   Lives home alone.  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    His Allergies Are:  No Known Allergies:   His Current Medications Are:  Outpatient Encounter Medications as of 09/20/2018  Medication Sig  . atorvastatin (LIPITOR) 40 MG tablet Take 40 mg by mouth daily.  . Melatonin 3 MG TABS Take by mouth. Takes one nightly  . [DISCONTINUED] divalproex (DEPAKOTE ER) 500 MG 24 hr tablet Take 1 tablet (500 mg total) by mouth at bedtime.   No facility-administered encounter medications on file as of 09/20/2018.   :  Review of Systems:  Out of a complete 14 point review of systems, all are reviewed and negative with the exception of these symptoms as listed below:  Review of Systems  Neurological:       Pt presents today to discuss his cpap. Pt reports that it is going well.   Objective:  Neurological Exam  Physical Exam Physical Examination:   Vitals:   09/20/18 1501  BP: (!) 169/95  Pulse: 88   General Examination: The patient is a very pleasant 33 y.o. male in no acute distress. He appears well-developed and well-nourished and well groomed.   HEENT:Normocephalic, atraumatic, pupils are equal, round and reactive to light and accommodation. Extraocular tracking is good without limitation to gaze excursion or nystagmus noted. Normal smooth pursuit is noted. Hearing is grossly intact. Face is symmetric with normal facial  animation and normal facial sensation. Speech is clear with no dysarthria noted. There is no hypophonia. There is no lip, neck/head, jaw or voice tremor. Neck shows FROM. Oropharynx exam reveals: unchanged.   Chest:Clear to auscultation without wheezing, rhonchi or crackles noted.  Heart:S1+S2+0, regular and normal without murmurs, rubs or gallops noted.   Abdomen:Soft, non-tender and non-distended with normal bowel sounds appreciated on auscultation.  Extremities:There isnoobvious change.   Skin: Warm and dry without trophic changes noted.  Musculoskeletal: exam reveals no obvious joint deformities, tenderness or joint swelling or erythema.   Neurologically:  Mental status: The patient is awake, alert and oriented in all 4 spheres.Hisimmediate and remote memory, attention, language skills and fund of knowledge are appropriate. There is no evidence of aphasia, agnosia, apraxia or anomia. Speech is clear with normal prosody and enunciation. Thought process is linear. Mood is normaland affect is normal.  Cranial nerves II - XII are as described above under HEENT exam.  Motor exam: Normal bulk, strength and tone is noted. There is no tremor. Fine motor skills and coordination:grosslyintact.  Cerebellar testing: No dysmetria or intention tremor.  Sensory exam: intact to light touch.  Gait, station and balance:Hestands easily. No veering to one side is noted. No leaning to one side is noted. Posture is age-appropriate and stance is narrow based. Gait showsnormalstride length and is unremarkable.   Assessmentand plan:  In summary,Jerik Simmonsis a very pleasant 43 year oldmalewith an underlying medical history of palpitations, lightheadedness, recent dizzy spell, hypertension, hyperlipidemia, s/p status post uvulectomy and TE/AE as a teenager, and morbid obesity with a BMI of over 40, who presents for follow-up consultation of his obstructive sleep apnea, on CPAP therapy.  He is intermittently compliant at this time. He is advised to be more consistent with his treatment as he will benefit if he is using his CPAP consistently. His baseline sleep study from March 2019 showed mild to moderate OSA. His titration study from April 2019 showed good results with CPAP of 12 cm. His compliance was  better when he first started treatment in the summer of 2019. He is advised to get back on track with the treatment, he needs new supplies and placed an order for this. He is encouraged to get in touch with his DME company for new supplies. He is working on weight loss and is encouraged to continue to work on it. He is advised to follow-up routinely in one year with one of our nurse practitioners. I answered all his questions today and he was in agreement. I spent 20 minutes in total face-to-face time with the patient, more than 50% of which was spent in counseling and coordination of care, reviewing test results, reviewing medication and discussing or reviewing the diagnosis of OSA, its prognosis and treatment options. Pertinent laboratory and imaging test results that were available during this visit with the patient were reviewed by me and considered in my medical decision making (see chart for details).

## 2018-09-21 NOTE — Progress Notes (Signed)
Order for cpap supplies sent to [facility] via community message. Confirmation received that the order transmitted was successful.  

## 2019-04-10 IMAGING — CR DG ORBITS FOR FOREIGN BODY
2 series · 2 of 2 positions shown · non-contrast
Comparison: None.

CLINICAL DATA: Metal working/exposure; clearance prior to MRI

EXAM:
ORBITS FOR FOREIGN BODY - 2 VIEW

[w orbit pa (1 of 2)]
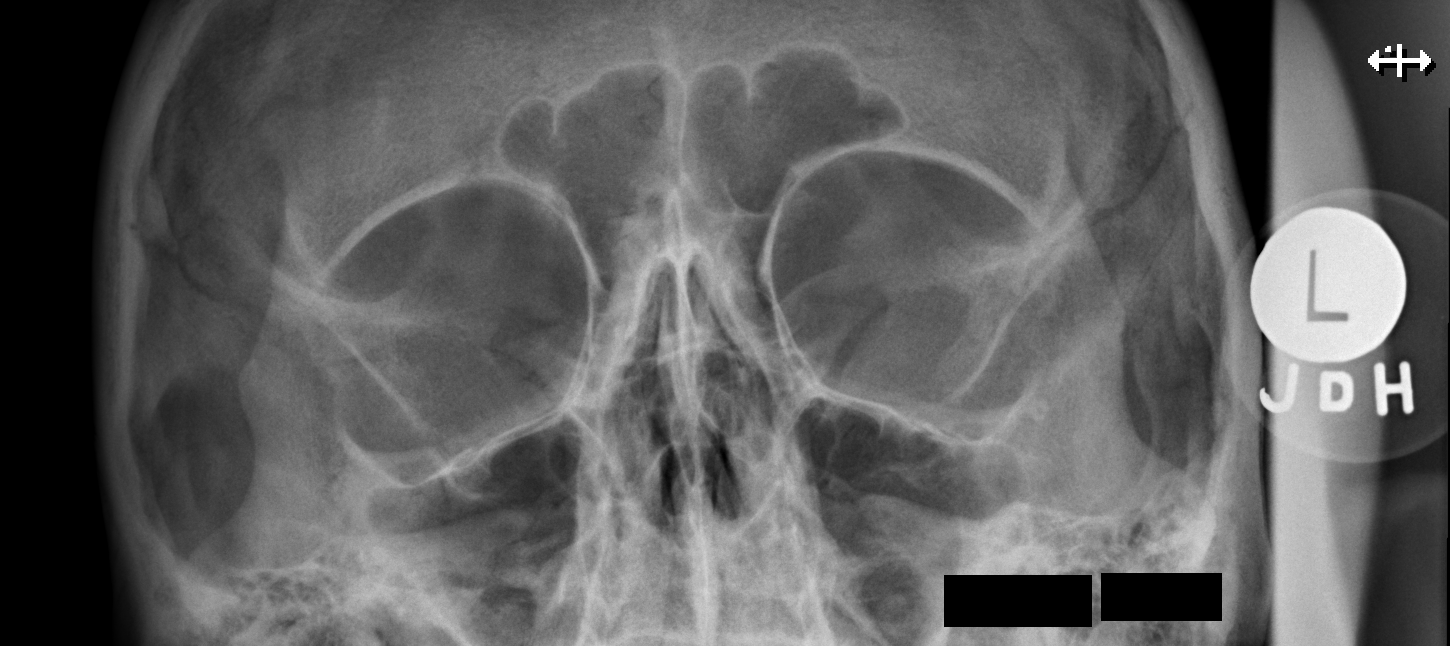

[w orbit pa (2 of 2)]
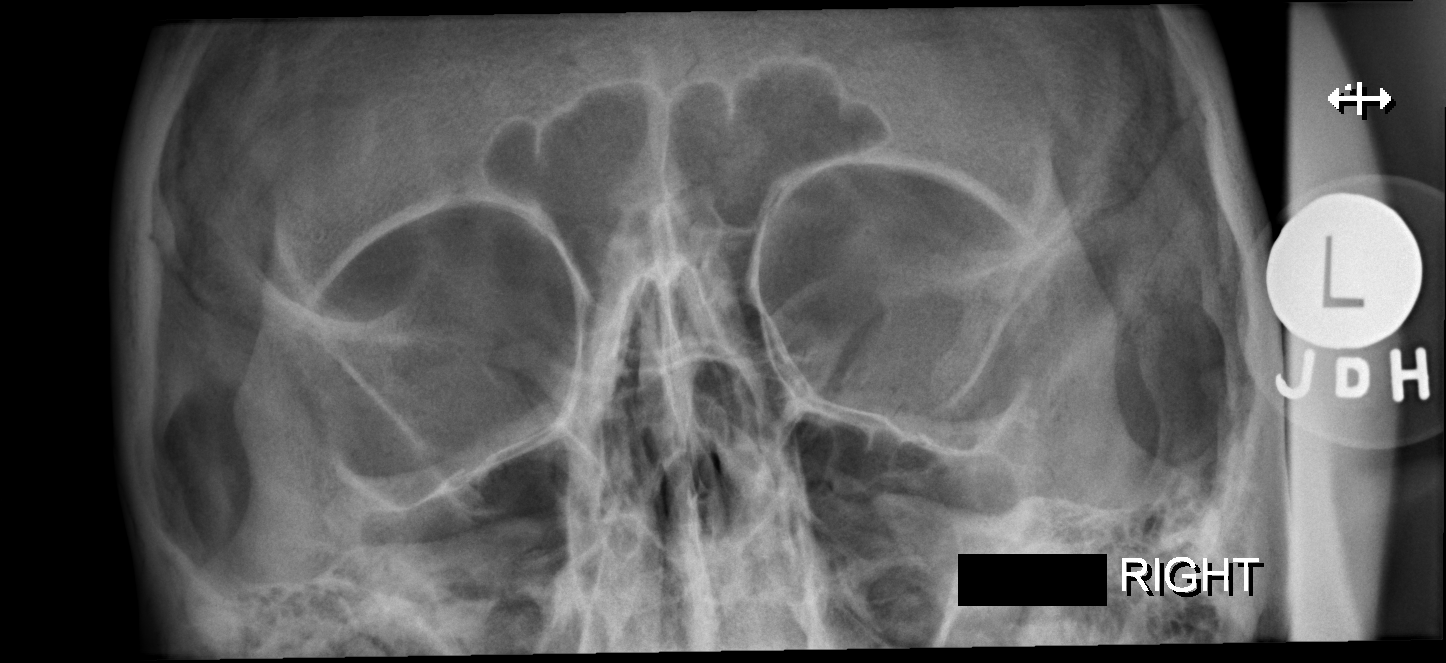

[2 of 2 positions shown; findings below may reference images not displayed]

FINDINGS: Water's views with eyes deviated toward the left and toward the
right were obtained. No intraorbital radiopaque foreign body. No
fracture or dislocation. Visualized paranasal sinuses are clear.
IMPRESSION: No evidence of metallic foreign body within the orbits.

## 2019-09-26 ENCOUNTER — Telehealth: Payer: Self-pay

## 2019-09-26 ENCOUNTER — Ambulatory Visit: Payer: BLUE CROSS/BLUE SHIELD | Admitting: Adult Health

## 2019-09-26 ENCOUNTER — Encounter: Payer: Self-pay | Admitting: Adult Health

## 2019-09-26 NOTE — Telephone Encounter (Signed)
Patient was a no call/no show for their appointment today.
